# Patient Record
Sex: Female | Born: 1995 | Race: Black or African American | Hispanic: No | Marital: Single | Smoking: Never smoker
Health system: Southern US, Community
[De-identification: ages and names within clinical notes are randomized; demographics above are authoritative.]

## PROBLEM LIST (undated history)

## (undated) DIAGNOSIS — R519 Headache, unspecified: Secondary | ICD-10-CM

## (undated) DIAGNOSIS — E669 Obesity, unspecified: Secondary | ICD-10-CM

## (undated) DIAGNOSIS — M329 Systemic lupus erythematosus, unspecified: Secondary | ICD-10-CM

## (undated) DIAGNOSIS — M3119 Other thrombotic microangiopathy: Secondary | ICD-10-CM

## (undated) DIAGNOSIS — M311 Thrombotic microangiopathy: Secondary | ICD-10-CM

## (undated) DIAGNOSIS — R51 Headache: Secondary | ICD-10-CM

## (undated) DIAGNOSIS — E119 Type 2 diabetes mellitus without complications: Secondary | ICD-10-CM

## (undated) DIAGNOSIS — R1013 Epigastric pain: Secondary | ICD-10-CM

## (undated) HISTORY — DX: Obesity, unspecified: E66.9

## (undated) HISTORY — DX: Other thrombotic microangiopathy: M31.19

## (undated) HISTORY — DX: Thrombotic microangiopathy: M31.1

## (undated) HISTORY — DX: Epigastric pain: R10.13

## (undated) HISTORY — DX: Headache, unspecified: R51.9

## (undated) HISTORY — DX: Headache: R51

## (undated) HISTORY — DX: Type 2 diabetes mellitus without complications: E11.9

## (undated) HISTORY — DX: Systemic lupus erythematosus, unspecified: M32.9

## (undated) HISTORY — PX: TONSILLECTOMY: SUR1361

---
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
## 2019-04-02 MED ORDER — KEFLEX 125 MG/5ML PO SUSR
1.00 | ORAL | Status: DC
Start: 2019-04-03 — End: 2019-04-02

## 2019-04-02 MED ORDER — ONDANSETRON 4 MG PO TBDP
4.00 | ORAL_TABLET | ORAL | Status: DC
Start: ? — End: 2019-04-02

## 2019-04-02 MED ORDER — MEDI-TUSSIN DM DOUBLE STRENGTH 30-200 MG/5ML PO LIQD
1000.00 | ORAL | Status: DC
Start: ? — End: 2019-04-02

## 2019-04-02 MED ORDER — POLYETHYLENE GLYCOL 3350 17 G PO PACK
17.00 | PACK | ORAL | Status: DC
Start: 2019-04-03 — End: 2019-04-02

## 2019-04-02 MED ORDER — DURACLON EP
1000.00 | EPIDURAL | Status: DC
Start: 2019-04-03 — End: 2019-04-02

## 2019-04-02 MED ORDER — PANTOPRAZOLE SODIUM 40 MG PO TBEC
40.00 | DELAYED_RELEASE_TABLET | ORAL | Status: DC
Start: 2019-04-02 — End: 2019-04-02

## 2019-04-02 MED ORDER — ALPROSTADIL (VASODILATOR) IC
50.00 | INTRACAVERNOUS | Status: DC
Start: 2019-04-02 — End: 2019-04-02

## 2019-04-02 MED ORDER — FERROUS SULFATE 325 (65 FE) MG PO TABS
325.00 | ORAL_TABLET | ORAL | Status: DC
Start: 2019-04-03 — End: 2019-04-02

## 2019-04-02 MED ORDER — Medication
500.00 | Status: DC
Start: 2019-04-02 — End: 2019-04-02

## 2019-04-02 MED ORDER — SENNOSIDES-DOCUSATE SODIUM 8.6-50 MG PO TABS
2.00 | ORAL_TABLET | ORAL | Status: DC
Start: 2019-04-01 — End: 2019-04-02

## 2019-11-21 MED ORDER — ERGOCALCIFEROL 200 MCG/ML PO SOLN
10000.00 | ORAL | Status: DC
Start: 2019-11-28 — End: 2019-11-21

## 2019-11-21 MED ORDER — DAPSONE 100 MG PO TABS
100.00 | ORAL_TABLET | ORAL | Status: DC
Start: 2019-11-22 — End: 2019-11-21

## 2019-11-21 MED ORDER — TOPIRAMATE 25 MG PO TABS
50.00 | ORAL_TABLET | ORAL | Status: DC
Start: 2019-11-21 — End: 2019-11-21

## 2019-11-21 MED ORDER — PANTOPRAZOLE SODIUM 40 MG PO TBEC
40.00 | DELAYED_RELEASE_TABLET | ORAL | Status: DC
Start: 2019-11-21 — End: 2019-11-21

## 2019-11-21 MED ORDER — ACETAMINOPHEN 325 MG PO TABS
650.00 | ORAL_TABLET | ORAL | Status: DC
Start: ? — End: 2019-11-21

## 2019-11-21 MED ORDER — MYCOPHENOLATE MOFETIL 250 MG PO CAPS
1000.00 | ORAL_CAPSULE | ORAL | Status: DC
Start: 2019-11-21 — End: 2019-11-21

## 2019-11-21 MED ORDER — CALCIUM CARBONATE 1250 (500 CA) MG PO TABS
1250.00 | ORAL_TABLET | ORAL | Status: DC
Start: 2019-11-22 — End: 2019-11-21

## 2019-11-21 MED ORDER — POLYETHYLENE GLYCOL 3350 17 GM/SCOOP PO POWD
17.00 | ORAL | Status: DC
Start: 2019-11-22 — End: 2019-11-21

## 2019-11-21 MED ORDER — LISINOPRIL 5 MG PO TABS
10.00 | ORAL_TABLET | ORAL | Status: DC
Start: 2019-11-21 — End: 2019-11-21

## 2019-11-21 MED ORDER — GENERIC EXTERNAL MEDICATION
60.00 | Status: DC
Start: 2019-11-22 — End: 2019-11-21

## 2022-07-30 ENCOUNTER — Other Ambulatory Visit: Payer: Self-pay

## 2022-07-30 DIAGNOSIS — N939 Abnormal uterine and vaginal bleeding, unspecified: Secondary | ICD-10-CM | POA: Insufficient documentation

## 2022-07-30 DIAGNOSIS — R079 Chest pain, unspecified: Secondary | ICD-10-CM | POA: Insufficient documentation

## 2022-07-30 DIAGNOSIS — R0981 Nasal congestion: Secondary | ICD-10-CM | POA: Insufficient documentation

## 2022-07-30 DIAGNOSIS — Z5321 Procedure and treatment not carried out due to patient leaving prior to being seen by health care provider: Secondary | ICD-10-CM | POA: Insufficient documentation

## 2022-07-30 DIAGNOSIS — R509 Fever, unspecified: Secondary | ICD-10-CM | POA: Insufficient documentation

## 2022-07-30 DIAGNOSIS — R202 Paresthesia of skin: Secondary | ICD-10-CM | POA: Insufficient documentation

## 2022-07-30 DIAGNOSIS — R059 Cough, unspecified: Secondary | ICD-10-CM | POA: Insufficient documentation

## 2022-07-30 DIAGNOSIS — R112 Nausea with vomiting, unspecified: Secondary | ICD-10-CM | POA: Insufficient documentation

## 2022-07-30 DIAGNOSIS — R251 Tremor, unspecified: Secondary | ICD-10-CM | POA: Insufficient documentation

## 2022-07-31 ENCOUNTER — Emergency Department (HOSPITAL_BASED_OUTPATIENT_CLINIC_OR_DEPARTMENT_OTHER)
Admission: EM | Admit: 2022-07-31 | Discharge: 2022-07-31 | Discharge status: Against Medical Advice | Payer: PRIVATE HEALTH INSURANCE | Attending: Emergency Medicine | Admitting: Emergency Medicine

## 2022-07-31 ENCOUNTER — Emergency Department (HOSPITAL_BASED_OUTPATIENT_CLINIC_OR_DEPARTMENT_OTHER): Payer: PRIVATE HEALTH INSURANCE | Admitting: Radiology

## 2022-07-31 ENCOUNTER — Encounter (HOSPITAL_BASED_OUTPATIENT_CLINIC_OR_DEPARTMENT_OTHER): Payer: Self-pay | Admitting: Emergency Medicine

## 2022-07-31 LAB — BASIC METABOLIC PANEL
Anion gap: 14 (ref 5–15)
BUN: 7 mg/dL (ref 6–20)
CO2: 20 mmol/L — ABNORMAL LOW (ref 22–32)
Calcium: 9.9 mg/dL (ref 8.9–10.3)
Chloride: 106 mmol/L (ref 98–111)
Creatinine, Ser: 0.89 mg/dL (ref 0.44–1.00)
GFR, Estimated: >60 mL/min (ref >60)
Glucose, Bld: 106 mg/dL — ABNORMAL HIGH (ref 70–99)
Potassium: 3.8 mmol/L (ref 3.5–5.1)
Sodium: 140 mmol/L (ref 135–145)

## 2022-07-31 LAB — CBC
HCT: 34.3 % — ABNORMAL LOW (ref 36.0–46.0)
HCT: 34.3 % — ABNORMAL LOW (ref 36.0–46.0)
Hemoglobin: 12.2 g/dL (ref 12.0–15.0)
Hemoglobin: 12.3 g/dL (ref 12.0–15.0)
MCH: 28.2 pg (ref 26.0–34.0)
MCH: 28.6 pg (ref 26.0–34.0)
MCHC: 35.6 g/dL (ref 30.0–36.0)
MCHC: 35.9 g/dL (ref 30.0–36.0)
MCV: 79.4 fL — ABNORMAL LOW (ref 80.0–100.0)
MCV: 79.8 fL — ABNORMAL LOW (ref 80.0–100.0)
Platelets: 184 10*3/uL (ref 150–400)
Platelets: 185 10*3/uL (ref 150–400)
RBC: 4.3 MIL/uL (ref 3.87–5.11)
RBC: 4.32 MIL/uL (ref 3.87–5.11)
RDW: 14.6 % (ref 11.5–15.5)
RDW: 14.6 % (ref 11.5–15.5)
WBC: 5.9 10*3/uL (ref 4.0–10.5)
WBC: 5.9 10*3/uL (ref 4.0–10.5)
nRBC: 0 % (ref 0.0–0.2)
nRBC: 0 % (ref 0.0–0.2)

## 2022-07-31 LAB — DIFFERENTIAL
Abs Immature Granulocytes: 0.01 10*3/uL (ref 0.00–0.07)
Basophils Absolute: 0 10*3/uL (ref 0.0–0.1)
Basophils Relative: 0 %
Eosinophils Absolute: 0 10*3/uL (ref 0.0–0.5)
Eosinophils Relative: 0 %
Immature Granulocytes: 0 %
Lymphocytes Relative: 36 %
Lymphs Abs: 2.1 10*3/uL (ref 0.7–4.0)
Monocytes Absolute: 0.3 10*3/uL (ref 0.1–1.0)
Monocytes Relative: 5 %
Neutro Abs: 3.5 10*3/uL (ref 1.7–7.7)
Neutrophils Relative %: 59 %

## 2022-07-31 LAB — LIPASE, BLOOD: Lipase: <10 U/L — ABNORMAL LOW (ref 11–51)

## 2022-07-31 LAB — TROPONIN I (HIGH SENSITIVITY): Troponin I (High Sensitivity): 3 ng/L (ref <18)

## 2022-07-31 MED ORDER — LORAZEPAM 2 MG/ML IJ SOLN
1.0000 mg | Freq: Once | INTRAMUSCULAR | Status: AC
  Administered 2022-07-31: 1 mg via INTRAVENOUS
  Filled 2022-07-31: qty 1

## 2022-07-31 MED ORDER — ONDANSETRON HCL 4 MG/2ML IJ SOLN
4.0000 mg | Freq: Once | INTRAMUSCULAR | Status: AC | PRN
  Administered 2022-07-31: 4 mg via INTRAVENOUS
  Filled 2022-07-31: qty 2

## 2022-07-31 MED ORDER — OXYCODONE-ACETAMINOPHEN 5-325 MG PO TABS: 1.0000 | ORAL_TABLET | Freq: Once | ORAL | Status: DC

## 2022-07-31 NOTE — ED Notes (Signed)
During CXR staff tells this RN that they noted delayed responses, shaking, and patient report of facial tingling. Spoke to the EDP about this and obtained verbal orders for ativan IV. Provided. Family able to wait with patient in lobby.

## 2022-07-31 NOTE — ED Notes (Signed)
Pt asking registration for RN to remove IV so she can leave without being seen. She is feeling improved after receiving the ativan. Encouraged patient to stay for treatment. She is A&Ox4 and ambulatory at discharge, speaking clearly and no longer shaking. Leaves with her boyfriend.

## 2022-07-31 NOTE — ED Triage Notes (Signed)
Presents for chest pain that started as abd pain yesterday. Emesis all day today, unable to tolerate her oral nausea medication d/t this, unable to tolerate PO intake. Worse with lying down.  H/o lupus Endorses vaginal bleeding (outside normal period bleeding), subjective fever, cough congestion starting today, cramping H/o surgery for ovarian cyst

## 2022-11-15 ENCOUNTER — Emergency Department (HOSPITAL_BASED_OUTPATIENT_CLINIC_OR_DEPARTMENT_OTHER): Payer: BC Managed Care – PPO

## 2022-11-15 ENCOUNTER — Encounter (HOSPITAL_BASED_OUTPATIENT_CLINIC_OR_DEPARTMENT_OTHER): Payer: Self-pay | Admitting: Emergency Medicine

## 2022-11-15 ENCOUNTER — Other Ambulatory Visit: Payer: Self-pay

## 2022-11-15 ENCOUNTER — Emergency Department (HOSPITAL_BASED_OUTPATIENT_CLINIC_OR_DEPARTMENT_OTHER)
Admission: EM | Admit: 2022-11-15 | Discharge: 2022-11-16 | Disposition: A | Discharge status: Home / Self Care | Payer: BC Managed Care – PPO | Attending: Emergency Medicine | Admitting: Emergency Medicine

## 2022-11-15 DIAGNOSIS — R112 Nausea with vomiting, unspecified: Secondary | ICD-10-CM | POA: Diagnosis not present

## 2022-11-15 DIAGNOSIS — R1084 Generalized abdominal pain: Secondary | ICD-10-CM | POA: Diagnosis not present

## 2022-11-15 DIAGNOSIS — E119 Type 2 diabetes mellitus without complications: Secondary | ICD-10-CM | POA: Insufficient documentation

## 2022-11-15 DIAGNOSIS — R1013 Epigastric pain: Secondary | ICD-10-CM

## 2022-11-15 DIAGNOSIS — R109 Unspecified abdominal pain: Secondary | ICD-10-CM | POA: Diagnosis present

## 2022-11-15 LAB — CBC
HCT: 28.7 % — ABNORMAL LOW (ref 36.0–46.0)
Hemoglobin: 10.5 g/dL — ABNORMAL LOW (ref 12.0–15.0)
MCH: 27.6 pg (ref 26.0–34.0)
MCHC: 36.6 g/dL — ABNORMAL HIGH (ref 30.0–36.0)
MCV: 75.3 fL — ABNORMAL LOW (ref 80.0–100.0)
Platelets: 195 10*3/uL (ref 150–400)
RBC: 3.81 MIL/uL — ABNORMAL LOW (ref 3.87–5.11)
RDW: 15 % (ref 11.5–15.5)
WBC: 5.8 10*3/uL (ref 4.0–10.5)
nRBC: 0 % (ref 0.0–0.2)

## 2022-11-15 LAB — LIPASE, BLOOD: Lipase: 21 U/L (ref 11–51)

## 2022-11-15 LAB — COMPREHENSIVE METABOLIC PANEL
ALT: 20 U/L (ref 0–44)
AST: 37 U/L (ref 15–41)
Albumin: 3 g/dL — ABNORMAL LOW (ref 3.5–5.0)
Alkaline Phosphatase: 37 U/L — ABNORMAL LOW (ref 38–126)
Anion gap: 8 (ref 5–15)
BUN: 15 mg/dL (ref 6–20)
CO2: 19 mmol/L — ABNORMAL LOW (ref 22–32)
Calcium: 8.7 mg/dL — ABNORMAL LOW (ref 8.9–10.3)
Chloride: 109 mmol/L (ref 98–111)
Creatinine, Ser: 1.08 mg/dL — ABNORMAL HIGH (ref 0.44–1.00)
GFR, Estimated: >60 mL/min (ref >60)
Glucose, Bld: 93 mg/dL (ref 70–99)
Potassium: 4.4 mmol/L (ref 3.5–5.1)
Sodium: 136 mmol/L (ref 135–145)
Total Bilirubin: 0.7 mg/dL (ref 0.3–1.2)
Total Protein: 7.8 g/dL (ref 6.5–8.1)

## 2022-11-15 LAB — HCG, SERUM, QUALITATIVE: Preg, Serum: NEGATIVE

## 2022-11-15 MED ORDER — IOHEXOL 300 MG/ML  SOLN
100.0000 mL | Freq: Once | INTRAMUSCULAR | Status: AC | PRN
  Administered 2022-11-16: 100 mL via INTRAVENOUS

## 2022-11-15 MED ORDER — HYDROMORPHONE HCL 1 MG/ML IJ SOLN
1.0000 mg | Freq: Once | INTRAMUSCULAR | Status: AC
  Administered 2022-11-15: 1 mg via INTRAVENOUS
  Filled 2022-11-15: qty 1

## 2022-11-15 MED ORDER — DICYCLOMINE HCL 20 MG PO TABS: 20.0000 mg | ORAL_TABLET | Freq: Two times a day (BID) | ORAL | 0 refills | Status: AC

## 2022-11-15 MED ORDER — ONDANSETRON HCL 4 MG/2ML IJ SOLN
4.0000 mg | Freq: Once | INTRAMUSCULAR | Status: AC
  Administered 2022-11-15: 4 mg via INTRAVENOUS
  Filled 2022-11-15: qty 2

## 2022-11-15 MED ORDER — LACTATED RINGERS IV BOLUS
2000.0000 mL | Freq: Once | INTRAVENOUS | Status: AC
  Administered 2022-11-15: 2000 mL via INTRAVENOUS

## 2022-11-15 MED ORDER — ONDANSETRON 4 MG PO TBDP: 4.0000 mg | ORAL_TABLET | Freq: Three times a day (TID) | ORAL | 0 refills | Status: AC | PRN

## 2022-11-15 NOTE — ED Triage Notes (Addendum)
Pt w/ abd pain (indicates upper and lower midline) since yesterday; +NV; last BM was Thurs (sts normal for her)

## 2022-11-15 NOTE — ED Provider Notes (Signed)
[facility] Provider Note   CSN: [id number] Arrival date & time: 11/15/22  1759     History  Chief Complaint  Patient presents with   Abdominal Pain    Samantha Wilcox is a 27 y.o. female.  27 year old female presents with her mom for evaluation of abdominal pain.  This started 3 days ago but significantly worsened yesterday.  This has been associated with nausea and vomiting and she has not been able to keep anything down since then.  She has history of lupus and was started on Plaquenil.  She had a dose of this yesterday.  Denies any hematemesis.  States she is also constipated and does take Linzess regularly.  She states with her recent bowel movement she had a lot of straining and noticed small amounts of bright red blood.  Denies any vaginal discharge, vaginal bleeding, dysuria, flank pain.  The history is provided by the patient and a parent. No language interpreter was used.       Home Medications Prior to Admission medications   Medication Sig Start Date End Date Taking? Authorizing Provider  amLODipine (NORVASC) 5 MG tablet Take 1 tablet (5 mg total) by mouth daily. 01/26/14 01/26/15  Jonathon Resides, MD  dexamethasone (DECADRON) 2 MG tablet Take 2 mg by mouth 2 (two) times daily with a meal.    [provider]  diclofenac (CATAFLAM) 50 MG tablet  02/08/13   [provider]  estradiol (ESTRACE) 2 MG tablet  03/14/13   [provider]  ferrous sulfate 325 (65 FE) MG tablet Take 325 mg by mouth 3 (three) times daily with meals.    [provider]  hydroxychloroquine (PLAQUENIL) 200 MG tablet Take 200 mg by mouth daily. Take two tablets po QD    [provider]  medroxyPROGESTERone (DEPO-PROVERA) 150 MG/ML injection Inject 150 mg into the muscle every 3 (three) months.    [provider]  pantoprazole (PROTONIX) 40 MG tablet Take 1 tablet (40 mg total) by mouth daily. 01/26/14 01/26/15   Jonathon Resides, MD  tiZANidine (ZANAFLEX) 4 MG tablet  03/31/13   [provider]  Vitamin D, Ergocalciferol, (DRISDOL) 50000 UNITS CAPS capsule Take 1 capsule po twice a week 01/26/14   Zanard, Bernadene Bell, MD      Allergies    Patient has no active allergies.    Review of Systems   Review of Systems  Constitutional:  Negative for chills and fever.  Respiratory:  Negative for shortness of breath.   Cardiovascular:  Negative for chest pain.  Gastrointestinal:  Positive for abdominal pain, blood in stool, constipation, nausea and vomiting.  Genitourinary:  Negative for dysuria, flank pain, vaginal bleeding and vaginal discharge.  All other systems reviewed and are negative.   Physical Exam Updated Vital Signs BP 125/88   Pulse 75   Temp 99.7 F (37.6 C)   Resp 16   Ht 5\' 5"  (1.651 m)   Wt 90.7 kg   SpO2 99%   BMI 33.28 kg/m  Physical Exam Vitals and nursing note reviewed.  Constitutional:      General: She is not in acute distress.    Appearance: Normal appearance. She is not ill-appearing.  HENT:     Head: Normocephalic and atraumatic.     Nose: Nose normal.  Eyes:     General: No scleral icterus.    Extraocular Movements: Extraocular movements intact.     Conjunctiva/sclera: Conjunctivae normal.  Cardiovascular:     Rate and Rhythm: Normal rate and regular rhythm.     Pulses: Normal pulses.  Pulmonary:     Effort: Pulmonary effort is normal. No respiratory distress.     Breath sounds: Normal breath sounds. No wheezing or rales.  Abdominal:     General: There is no distension.     Palpations: Abdomen is soft.     Tenderness: There is abdominal tenderness (generalized). There is no guarding.  Musculoskeletal:        General: Normal range of motion.     Cervical back: Normal range of motion.  Skin:    General: Skin is warm and dry.  Neurological:     General: No focal deficit present.     Mental Status: She is alert. Mental status is at baseline.      ED Results / Procedures / Treatments   Labs (all labs ordered are listed, but only abnormal results are displayed) Labs Reviewed  COMPREHENSIVE METABOLIC PANEL - Abnormal; Notable for the following components:      Result Value   CO2 19 (*)    Creatinine, Ser 1.08 (*)    Calcium 8.7 (*)    Albumin 3.0 (*)    Alkaline Phosphatase 37 (*)    All other components within normal limits  CBC - Abnormal; Notable for the following components:   RBC 3.81 (*)    Hemoglobin 10.5 (*)    HCT 28.7 (*)    MCV 75.3 (*)    MCHC 36.6 (*)    All other components within normal limits  LIPASE, BLOOD  URINALYSIS, ROUTINE W REFLEX MICROSCOPIC  PREGNANCY, URINE  HCG, SERUM, QUALITATIVE    EKG None  Radiology US Abdomen Limited RUQ (LIVER/GB)  Result Date: 11/15/2022 CLINICAL DATA:  Right upper quadrant pain EXAM: ULTRASOUND ABDOMEN LIMITED RIGHT UPPER QUADRANT COMPARISON:  07/31/2022 FINDINGS: Gallbladder: No gallstones or wall thickening visualized. No sonographic Murphy sign noted by sonographer. Common bile duct: Diameter: 3.5 mm. Liver: No focal lesion identified. Within normal limits in parenchymal echogenicity. Portal vein is patent on color Doppler imaging with normal direction of blood flow towards the liver. Other: None. IMPRESSION: No acute abnormality noted. Electronically Signed   By: Alcide Clever M.D.   On: 11/15/2022 21:16    Procedures Procedures    Medications Ordered in ED Medications  HYDROmorphone (DILAUDID) injection 1 mg (1 mg Intravenous Given 11/15/22 2214)  ondansetron (ZOFRAN) injection 4 mg (4 mg Intravenous Given 11/15/22 2212)  lactated ringers bolus 2,000 mL (2,000 mLs Intravenous New Bag/Given 11/15/22 2218)    ED Course/ Medical Decision Making/ A&P                             Medical Decision Making Amount and/or Complexity of Data Reviewed Labs: ordered. Radiology: ordered.  Risk Prescription drug management.   Medical Decision Making / ED  Course   This patient presents to the ED for concern of abdominal pain, this involves an extensive number of treatment options, and is a complaint that carries with it a high risk of complications and morbidity.  The differential diagnosis includes gastroenteritis, appendicitis, pancreatitis, cholecystitis, acute on chronic abdominal pain, gastroparesis  MDM: 27 year old female presents with her mom for evaluation of abdominal pain.  Ongoing for 3 days worse since yesterday associated with nausea and vomiting.  During initial evaluation patient was actively dry heaving.  Improved after dose of Dilaudid and Zofran.  2 L fluid bolus ordered.  Patient on reevaluation is able to provide more history.  She is much more comfortable now as well.  She has had similar episodes in the past and does follow with gastroenterology as well.  She does endorse constipation.  Takes Linzess however she states instead of taking it daily she takes it every other day due to challenges at work.  She states she has not received an answer as to where her symptoms are coming from.  She states she could not manage her symptoms at home to the emergency department.  CBC shows no leukocytosis.  Mild anemia which is around her baseline.  CMP shows preserved renal function, normal electrolytes.  LFTs are normal.  Lipase within normal limit.  Right upper quadrant ultrasound was done through triage which shows no acute process.  Will order CT abdomen pelvis with contrast.  Patient signed out to attending who will follow-up on CT imaging.  I have sent Bentyl and Zofran into the pharmacy for the patient for when she is discharged.  Lab Tests: -I ordered, reviewed, and interpreted labs.   The pertinent results include:   Labs Reviewed  COMPREHENSIVE METABOLIC PANEL - Abnormal; Notable for the following components:      Result Value   CO2 19 (*)    Creatinine, Ser 1.08 (*)    Calcium 8.7 (*)    Albumin 3.0 (*)    Alkaline Phosphatase  37 (*)    All other components within normal limits  CBC - Abnormal; Notable for the following components:   RBC 3.81 (*)    Hemoglobin 10.5 (*)    HCT 28.7 (*)    MCV 75.3 (*)    MCHC 36.6 (*)    All other components within normal limits  LIPASE, BLOOD  URINALYSIS, ROUTINE W REFLEX MICROSCOPIC  PREGNANCY, URINE  HCG, SERUM, QUALITATIVE      EKG  EKG Interpretation  Date/Time:    Ventricular Rate:    PR Interval:    QRS Duration:   QT Interval:    QTC Calculation:   R Axis:     Text Interpretation:           Imaging Studies ordered: I ordered imaging studies including CT abdomen pelvis with contrast I independently visualized and interpreted imaging. I agree with the radiologist interpretation   Medicines ordered and prescription drug management: Meds ordered this encounter  Medications   HYDROmorphone (DILAUDID) injection 1 mg   ondansetron (ZOFRAN) injection 4 mg   lactated ringers bolus 2,000 mL    -I have reviewed the patients home medicines and have made adjustments as needed  Critical interventions Pain control, fluid boluses   Reevaluation: After the interventions noted above, I reevaluated the patient and found that they have :stayed the same  Co morbidities that complicate the patient evaluation  Past Medical History:  Diagnosis Date   Abdominal pain, epigastric    Headache    Lupus ([place]) 07/2013   Migraine    Obesity    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 Stark Jock for follow up on CT imaging and re-eval.   Final Clinical Impression(s) / ED Diagnoses Final diagnoses:  Generalized abdominal pain    Rx / DC Orders ED Discharge Orders  Ordered    dicyclomine (BENTYL) 20 MG tablet  2 times daily        11/15/22 2353    ondansetron (ZOFRAN-ODT) 4 MG disintegrating  tablet  Every 8 hours PRN        11/15/22 2353              Marita Kansas, PA-C 11/15/22 2354    Geoffery Lyons, MD 11/16/22 [phone]

## 2022-11-15 NOTE — ED Notes (Signed)
Pt states she is unable to urinate at this time.  Fluids going now

## 2022-11-15 NOTE — ED Notes (Signed)
Pain in the abdomen started yesterday as well as the nausea and vomiting.

## 2022-11-16 LAB — URINALYSIS, ROUTINE W REFLEX MICROSCOPIC
Glucose, UA: NEGATIVE mg/dL
Ketones, ur: 80 mg/dL — AB
Leukocytes,Ua: NEGATIVE
Nitrite: NEGATIVE
Protein, ur: >=300 mg/dL — AB
Specific Gravity, Urine: 1.02 (ref 1.005–1.030)
pH: 6 (ref 5.0–8.0)

## 2022-11-16 LAB — URINALYSIS, MICROSCOPIC (REFLEX)

## 2022-11-16 MED ORDER — OXYCODONE-ACETAMINOPHEN 5-325 MG PO TABS
2.0000 | ORAL_TABLET | Freq: Once | ORAL | Status: AC
  Administered 2022-11-16: 2 via ORAL
  Filled 2022-11-16: qty 2

## 2022-11-16 NOTE — ED Notes (Signed)
Pt to CT

## 2022-11-16 NOTE — Discharge Instructions (Signed)
Continue medications as previously prescribed.  Follow-up with your gastroenterologist in the next few days if symptoms are not improving.
